# Patient Record
Sex: Female | Born: 1955 | Hispanic: Yes | Marital: Married | Smoking: Never smoker
Health system: Southern US, Community
[De-identification: ages and names within clinical notes are randomized; demographics above are authoritative.]

## PROBLEM LIST (undated history)

## (undated) DIAGNOSIS — K7581 Nonalcoholic steatohepatitis (NASH): Secondary | ICD-10-CM

## (undated) DIAGNOSIS — C801 Malignant (primary) neoplasm, unspecified: Secondary | ICD-10-CM

## (undated) DIAGNOSIS — C541 Malignant neoplasm of endometrium: Secondary | ICD-10-CM

## (undated) DIAGNOSIS — R102 Pelvic and perineal pain: Secondary | ICD-10-CM

## (undated) DIAGNOSIS — E119 Type 2 diabetes mellitus without complications: Secondary | ICD-10-CM

## (undated) DIAGNOSIS — I10 Essential (primary) hypertension: Secondary | ICD-10-CM

## (undated) DIAGNOSIS — E7800 Pure hypercholesterolemia, unspecified: Secondary | ICD-10-CM

## (undated) DIAGNOSIS — E669 Obesity, unspecified: Secondary | ICD-10-CM

## (undated) DIAGNOSIS — K219 Gastro-esophageal reflux disease without esophagitis: Secondary | ICD-10-CM

## (undated) DIAGNOSIS — M1990 Unspecified osteoarthritis, unspecified site: Secondary | ICD-10-CM

## (undated) HISTORY — PX: ABDOMINAL HYSTERECTOMY: SHX81

## (undated) HISTORY — PX: TUBAL LIGATION: SHX77

---

## 2006-09-20 ENCOUNTER — Ambulatory Visit: Payer: Self-pay | Admitting: Internal Medicine

## 2007-01-17 ENCOUNTER — Ambulatory Visit: Payer: Self-pay | Admitting: Internal Medicine

## 2007-01-25 ENCOUNTER — Ambulatory Visit: Payer: Self-pay | Admitting: Internal Medicine

## 2014-10-13 ENCOUNTER — Ambulatory Visit: Payer: Self-pay | Admitting: Gastroenterology

## 2016-04-12 ENCOUNTER — Other Ambulatory Visit: Payer: Self-pay | Admitting: Physical Medicine and Rehabilitation

## 2016-04-12 DIAGNOSIS — M5416 Radiculopathy, lumbar region: Secondary | ICD-10-CM

## 2016-05-05 ENCOUNTER — Encounter (HOSPITAL_COMMUNITY): Payer: Self-pay | Admitting: Radiology

## 2016-05-05 ENCOUNTER — Ambulatory Visit
Admission: RE | Admit: 2016-05-05 | Discharge: 2016-05-05 | Disposition: A | Discharge status: Home / Self Care | Payer: Self-pay | Source: Ambulatory Visit | Attending: Physical Medicine and Rehabilitation | Admitting: Physical Medicine and Rehabilitation

## 2016-05-05 DIAGNOSIS — M48061 Spinal stenosis, lumbar region without neurogenic claudication: Secondary | ICD-10-CM | POA: Insufficient documentation

## 2016-05-05 DIAGNOSIS — M5416 Radiculopathy, lumbar region: Secondary | ICD-10-CM | POA: Insufficient documentation

## 2016-11-24 ENCOUNTER — Ambulatory Visit
Admission: RE | Admit: 2016-11-24 | Discharge: 2016-11-24 | Disposition: A | Discharge status: Home / Self Care | Payer: Self-pay | Source: Ambulatory Visit | Attending: Internal Medicine | Admitting: Internal Medicine

## 2016-11-24 ENCOUNTER — Other Ambulatory Visit: Payer: Self-pay | Admitting: Internal Medicine

## 2016-11-24 DIAGNOSIS — R1012 Left upper quadrant pain: Secondary | ICD-10-CM

## 2016-11-24 DIAGNOSIS — K760 Fatty (change of) liver, not elsewhere classified: Secondary | ICD-10-CM | POA: Insufficient documentation

## 2016-11-24 DIAGNOSIS — R1032 Left lower quadrant pain: Secondary | ICD-10-CM | POA: Insufficient documentation

## 2016-11-24 HISTORY — DX: Malignant (primary) neoplasm, unspecified: C80.1

## 2016-11-24 HISTORY — DX: Essential (primary) hypertension: I10

## 2016-11-24 LAB — POCT I-STAT CREATININE: CREATININE: 0.6 mg/dL (ref 0.44–1.00)

## 2016-11-24 MED ORDER — IOPAMIDOL (ISOVUE-300) INJECTION 61%
100.0000 mL | Freq: Once | INTRAVENOUS | Status: AC | PRN
  Administered 2016-11-24: 100 mL via INTRAVENOUS

## 2017-05-26 ENCOUNTER — Other Ambulatory Visit: Payer: Self-pay | Admitting: Obstetrics and Gynecology

## 2017-05-26 DIAGNOSIS — R1032 Left lower quadrant pain: Secondary | ICD-10-CM

## 2017-10-30 ENCOUNTER — Encounter: Payer: Self-pay | Admitting: *Deleted

## 2017-10-31 ENCOUNTER — Encounter
Admission: RE | Disposition: A | Discharge status: Home / Self Care | Payer: Self-pay | Source: Ambulatory Visit | Attending: Internal Medicine

## 2017-10-31 ENCOUNTER — Ambulatory Visit: Payer: Self-pay | Admitting: Anesthesiology

## 2017-10-31 ENCOUNTER — Ambulatory Visit
Admission: RE | Admit: 2017-10-31 | Discharge: 2017-10-31 | Disposition: A | Discharge status: Home / Self Care | Payer: Self-pay | Source: Ambulatory Visit | Attending: Internal Medicine | Admitting: Internal Medicine

## 2017-10-31 ENCOUNTER — Encounter: Payer: Self-pay | Admitting: Anesthesiology

## 2017-10-31 DIAGNOSIS — K219 Gastro-esophageal reflux disease without esophagitis: Secondary | ICD-10-CM | POA: Insufficient documentation

## 2017-10-31 DIAGNOSIS — Z6841 Body Mass Index (BMI) 40.0 and over, adult: Secondary | ICD-10-CM | POA: Insufficient documentation

## 2017-10-31 DIAGNOSIS — E669 Obesity, unspecified: Secondary | ICD-10-CM | POA: Insufficient documentation

## 2017-10-31 DIAGNOSIS — I10 Essential (primary) hypertension: Secondary | ICD-10-CM | POA: Insufficient documentation

## 2017-10-31 DIAGNOSIS — M170 Bilateral primary osteoarthritis of knee: Secondary | ICD-10-CM | POA: Insufficient documentation

## 2017-10-31 DIAGNOSIS — E7800 Pure hypercholesterolemia, unspecified: Secondary | ICD-10-CM | POA: Insufficient documentation

## 2017-10-31 DIAGNOSIS — Z79899 Other long term (current) drug therapy: Secondary | ICD-10-CM | POA: Insufficient documentation

## 2017-10-31 DIAGNOSIS — K7581 Nonalcoholic steatohepatitis (NASH): Secondary | ICD-10-CM | POA: Insufficient documentation

## 2017-10-31 DIAGNOSIS — E119 Type 2 diabetes mellitus without complications: Secondary | ICD-10-CM | POA: Insufficient documentation

## 2017-10-31 DIAGNOSIS — Z8544 Personal history of malignant neoplasm of other female genital organs: Secondary | ICD-10-CM | POA: Insufficient documentation

## 2017-10-31 DIAGNOSIS — Z86010 Personal history of colonic polyps: Secondary | ICD-10-CM | POA: Insufficient documentation

## 2017-10-31 DIAGNOSIS — Z880 Allergy status to penicillin: Secondary | ICD-10-CM | POA: Insufficient documentation

## 2017-10-31 DIAGNOSIS — Z7984 Long term (current) use of oral hypoglycemic drugs: Secondary | ICD-10-CM | POA: Insufficient documentation

## 2017-10-31 DIAGNOSIS — K640 First degree hemorrhoids: Secondary | ICD-10-CM | POA: Insufficient documentation

## 2017-10-31 DIAGNOSIS — Z1211 Encounter for screening for malignant neoplasm of colon: Secondary | ICD-10-CM | POA: Insufficient documentation

## 2017-10-31 HISTORY — DX: Malignant neoplasm of endometrium: C54.1

## 2017-10-31 HISTORY — DX: Unspecified osteoarthritis, unspecified site: M19.90

## 2017-10-31 HISTORY — PX: COLONOSCOPY WITH PROPOFOL: SHX5780

## 2017-10-31 HISTORY — DX: Pelvic and perineal pain: R10.2

## 2017-10-31 HISTORY — DX: Obesity, unspecified: E66.9

## 2017-10-31 HISTORY — DX: Pure hypercholesterolemia, unspecified: E78.00

## 2017-10-31 HISTORY — DX: Nonalcoholic steatohepatitis (NASH): K75.81

## 2017-10-31 HISTORY — DX: Type 2 diabetes mellitus without complications: E11.9

## 2017-10-31 HISTORY — DX: Gastro-esophageal reflux disease without esophagitis: K21.9

## 2017-10-31 LAB — GLUCOSE, CAPILLARY: GLUCOSE-CAPILLARY: 164 mg/dL — AB (ref 65–99)

## 2017-10-31 SURGERY — COLONOSCOPY WITH PROPOFOL
Anesthesia: General

## 2017-10-31 MED ORDER — PROPOFOL 500 MG/50ML IV EMUL
INTRAVENOUS | Status: DC | PRN
  Administered 2017-10-31: 140 ug/kg/min via INTRAVENOUS

## 2017-10-31 MED ORDER — PROPOFOL 10 MG/ML IV BOLUS
INTRAVENOUS | Status: AC
  Filled 2017-10-31: qty 20

## 2017-10-31 MED ORDER — LIDOCAINE HCL (PF) 1 % IJ SOLN
2.0000 mL | Freq: Once | INTRAMUSCULAR | Status: AC
  Administered 2017-10-31: 0.3 mL via INTRADERMAL

## 2017-10-31 MED ORDER — LIDOCAINE 2% (20 MG/ML) 5 ML SYRINGE
INTRAMUSCULAR | Status: DC | PRN
  Administered 2017-10-31: 30 mg via INTRAVENOUS

## 2017-10-31 MED ORDER — LIDOCAINE HCL (PF) 1 % IJ SOLN
INTRAMUSCULAR | Status: AC
  Administered 2017-10-31: 0.3 mL via INTRADERMAL
  Filled 2017-10-31: qty 2

## 2017-10-31 MED ORDER — FENTANYL CITRATE (PF) 100 MCG/2ML IJ SOLN
INTRAMUSCULAR | Status: DC | PRN
  Administered 2017-10-31 (×2): 50 ug via INTRAVENOUS

## 2017-10-31 MED ORDER — PROPOFOL 500 MG/50ML IV EMUL
INTRAVENOUS | Status: AC
  Filled 2017-10-31: qty 50

## 2017-10-31 MED ORDER — LIDOCAINE HCL (PF) 2 % IJ SOLN
INTRAMUSCULAR | Status: AC
  Filled 2017-10-31: qty 10

## 2017-10-31 MED ORDER — FENTANYL CITRATE (PF) 100 MCG/2ML IJ SOLN
INTRAMUSCULAR | Status: AC
  Filled 2017-10-31: qty 2

## 2017-10-31 MED ORDER — SODIUM CHLORIDE 0.9 % IV SOLN
INTRAVENOUS | Status: DC
  Administered 2017-10-31: 09:00:00 via INTRAVENOUS

## 2017-10-31 MED ORDER — PROPOFOL 10 MG/ML IV BOLUS
INTRAVENOUS | Status: DC | PRN
  Administered 2017-10-31: 100 mg via INTRAVENOUS

## 2017-10-31 NOTE — Op Note (Signed)
[facility] Gastroenterology Patient Name: Diane Campos Procedure Date: 10/31/2017 9:02 AM MRN: [id number] Account #: [id number] Date of Birth: 08/05/1955 Admit Type: Outpatient Age: 62 Room: [facility] ENDO ROOM 3 Gender: Female Note Status: Finalized Procedure:            Colonoscopy Indications:          High risk colon cancer surveillance: Personal history                        of colonic polyps Providers:            Benay Pike. Sedale Jenifer MD, MD Medicines:            Propofol per Anesthesia Complications:        No immediate complications. Procedure:            Pre-Anesthesia Assessment:                       - The risks and benefits of the procedure and the                        sedation options and risks were discussed with the                        patient. All questions were answered and informed                        consent was obtained.                       - Patient identification and proposed procedure were                        verified prior to the procedure by the nurse. The                        procedure was verified in the procedure room.                       - ASA Grade Assessment: II - A patient with mild                        systemic disease.                       - After reviewing the risks and benefits, the patient                        was deemed in satisfactory condition to undergo the                        procedure.                       After obtaining informed consent, the colonoscope was                        passed under direct vision. Throughout the procedure,                        the patient's blood pressure, pulse, and oxygen  saturations were monitored continuously. The                        Colonoscope was introduced through the anus and                        advanced to the the cecum, identified by appendiceal                        orifice and ileocecal valve. The colonoscopy was                     performed without difficulty. The patient tolerated the                        procedure well. The quality of the bowel preparation                        was good. Findings:      The perianal and digital rectal examinations were normal. Pertinent       negatives include normal sphincter tone and no palpable rectal lesions.      The colon (entire examined portion) appeared normal.      Non-bleeding internal hemorrhoids were found during retroflexion. The       hemorrhoids were Grade I (internal hemorrhoids that do not prolapse). Impression:           - The entire examined colon is normal.                       - Non-bleeding internal hemorrhoids.                       - No specimens collected. Recommendation:       - Patient has a contact number available for                        emergencies. The signs and symptoms of potential                        delayed complications were discussed with the patient.                        Return to normal activities tomorrow. Written discharge                        instructions were provided to the patient.                       - Resume previous diet.                       - Continue present medications.                       - Repeat colonoscopy in 5 years for surveillance.                       - Return to GI office PRN. Procedure Code(s):    --- Professional ---                       Q2229, Colorectal cancer screening; colonoscopy  on                        individual at high risk Diagnosis Code(s):    --- Professional ---                       K64.0, First degree hemorrhoids                       Z86.010, Personal history of colonic polyps CPT copyright 2017 [employer]. All rights reserved. The codes documented in this report are preliminary and upon coder review may  be revised to meet current compliance requirements. Efrain Sella MD, MD 10/31/2017 9:23:10 AM This report has been signed  electronically. Number of Addenda: 0 Note Initiated On: 10/31/2017 9:02 AM Scope Withdrawal Time: 0 hours 7 minutes 25 seconds  Total Procedure Duration: 0 hours 9 minutes 56 seconds       [facility]

## 2017-10-31 NOTE — Interval H&P Note (Signed)
History and Physical Interval Note:  10/31/2017 8:46 AM  [place]  has presented today for surgery, with the diagnosis of HX POLYPS  The various methods of treatment have been discussed with the patient and family. After consideration of risks, benefits and other options for treatment, the patient has consented to  Procedure(s): COLONOSCOPY WITH PROPOFOL (N/A) as a surgical intervention .  The patient's history has been reviewed, patient examined, no change in status, stable for surgery.  I have reviewed the patient's chart and labs.  Questions were answered to the patient's satisfaction.     [place], [place]

## 2017-10-31 NOTE — Transfer of Care (Signed)
Immediate Anesthesia Transfer of Care Note  Patient: Diane Campos  Procedure(s) Performed: COLONOSCOPY WITH PROPOFOL (N/A )  Patient Location: PACU and Endoscopy Unit  Anesthesia Type:General  Level of Consciousness: sedated and drowsy  Airway & Oxygen Therapy: Patient Spontanous Breathing and Patient connected to nasal cannula oxygen  Post-op Assessment: Report given to RN and Post -op Vital signs reviewed and stable  Post vital signs: Reviewed and stable  Last Vitals:  Vitals Value Taken Time  BP    Temp    Pulse 79 10/31/2017  9:22 AM  Resp    SpO2 100 % 10/31/2017  9:22 AM  Vitals shown include unvalidated device data.  Last Pain:  Vitals:   10/31/17 0832  TempSrc: Tympanic  PainSc: 0-No pain         Complications: No apparent anesthesia complications

## 2017-10-31 NOTE — Anesthesia Post-op Follow-up Note (Signed)
Anesthesia QCDR form completed.        

## 2017-10-31 NOTE — H&P (Signed)
  Outpatient short stay form Pre-procedure 10/31/2017 8:44 AM Gerardo Territo K. Alice Reichert, M.D.  Primary Physician: Glendon Axe, M.D.  Reason for visit:  Personal hx of colon polyps.  History of present illness:  Patient presents for colon polyp surveillance. A Spanish interpreter was utilized.The patient denies abdominal pain, abnormal weight loss or rectal bleeding.     Current Facility-Administered Medications:  .  lidocaine (PF) (XYLOCAINE) 1 % injection, , , ,  .  0.9 %  sodium chloride infusion, , Intravenous, Continuous, Vincenza Dail K, MD .  lidocaine (PF) (XYLOCAINE) 1 % injection 2 mL, 2 mL, Intradermal, Once, Marvel, Benay Pike, MD  Medications Prior to Admission  Medication Sig Dispense Refill Last Dose  . atorvastatin (LIPITOR) 20 MG tablet Take 20 mg by mouth daily at 6 PM.     . diclofenac sodium (VOLTAREN) 1 % GEL Apply 2 g topically 2 (two) times daily at 10 AM and 5 PM.     . glimepiride (AMARYL) 4 MG tablet Take 4 mg by mouth 2 (two) times daily.     . metFORMIN (GLUCOPHAGE) 1000 MG tablet Take 1,000 mg by mouth 2 (two) times daily with a meal.     . pantoprazole (PROTONIX) 40 MG tablet Take 40 mg by mouth daily.     . pioglitazone (ACTOS) 15 MG tablet Take 15 mg by mouth daily.     . sitaGLIPtin (JANUVIA) 100 MG tablet Take 100 mg by mouth daily.     Marland Kitchen triamcinolone cream (KENALOG) 0.1 % Apply 1 application topically 2 (two) times daily.        Allergies  Allergen Reactions  . Penicillins Itching and Rash     Past Medical History:  Diagnosis Date  . Arthritis    Osteoarthritis of both knees  . Cancer ([place])   . Diabetes mellitus without complication ([place])   . Endometrial cancer ([place])   . GERD (gastroesophageal reflux disease)   . Hypercholesterolemia   . Hypertension   . Nonalcoholic steatohepatitis ([place])   . Obesity   . Pelvic pain in female     Review of systems:   Negative.  Physical Exam  Gen: Alert, oriented. Appears stated age.  HEENT: [place]/AT.  PERRLA. Lungs: CTA, no wheezes. CV: RR nl S1, S2. Abd: soft, benign, no masses. BS+ Ext: No edema. Pulses 2+    Planned procedures:  1. Colonoscopy. The patient understands the nature of the planned procedure, indications, risks, alternatives and potential complications including but not limited to bleeding, infection, perforation, damage to internal organs and possible oversedation/side effects from anesthesia. The patient agrees and gives consent to proceed.  Please refer to procedure notes for findings, recommendations and patient disposition/instructions.    Ladona Rosten K. Alice Reichert, M.D. Gastroenterology 10/31/2017  8:44 AM

## 2017-10-31 NOTE — Anesthesia Postprocedure Evaluation (Signed)
Anesthesia Post Note  Patient: Diane Campos  Procedure(s) Performed: COLONOSCOPY WITH PROPOFOL (N/A )  Patient location during evaluation: Endoscopy Anesthesia Type: General Level of consciousness: awake and alert Pain management: pain level controlled Vital Signs Assessment: post-procedure vital signs reviewed and stable Respiratory status: spontaneous breathing, nonlabored ventilation, respiratory function stable and patient connected to nasal cannula oxygen Cardiovascular status: blood pressure returned to baseline and stable Postop Assessment: no apparent nausea or vomiting Anesthetic complications: no     Last Vitals:  Vitals:   10/31/17 0923 10/31/17 0924  BP:  (!) 97/56  Pulse:  76  Resp: (!) 9 10  Temp: (!) 36.1 C (!) 36.1 C  SpO2:  100%    Last Pain:  Vitals:   10/31/17 0953  TempSrc:   PainSc: 0-No pain                 Halley Shepheard S

## 2017-10-31 NOTE — Anesthesia Preprocedure Evaluation (Signed)
Anesthesia Evaluation  Patient identified by MRN, date of birth, ID band Patient awake    Reviewed: Allergy & Precautions, NPO status , Patient's Chart, lab work & pertinent test results, reviewed documented beta blocker date and time   Airway Mallampati: II  TM Distance: >3 FB     Dental  (+) Chipped   Pulmonary           Cardiovascular hypertension, Pt. on medications      Neuro/Psych    GI/Hepatic GERD  ,(+) Hepatitis -  Endo/Other  diabetes, Type 2  Renal/GU      Musculoskeletal  (+) Arthritis ,   Abdominal   Peds  Hematology   Anesthesia Other Findings   Reproductive/Obstetrics                             Anesthesia Physical Anesthesia Plan  ASA: II  Anesthesia Plan: General   Post-op Pain Management:    Induction: Intravenous  PONV Risk Score and Plan:   Airway Management Planned:   Additional Equipment:   Intra-op Plan:   Post-operative Plan:   Informed Consent: I have reviewed the patients History and Physical, chart, labs and discussed the procedure including the risks, benefits and alternatives for the proposed anesthesia with the patient or authorized representative who has indicated his/her understanding and acceptance.     Plan Discussed with: CRNA  Anesthesia Plan Comments:         Anesthesia Quick Evaluation

## 2017-11-02 ENCOUNTER — Encounter: Payer: Self-pay | Admitting: Internal Medicine

## 2018-02-17 IMAGING — CT CT ABD-PELV W/ CM
2 of 5 series · 16 of 46 positions shown, 18 images · IV contrast (APPLIED)
Comparison: Abdominal ultrasound January 17, 2007

CLINICAL DATA: LEFT abdominal pain for 3 months, worsening for few
weeks. History of uterine cancer and hysterectomy.

EXAM:
CT ABDOMEN AND PELVIS WITH CONTRAST
TECHNIQUE: Multidetector CT imaging of the abdomen and pelvis was performed
using the standard protocol following bolus administration of
intravenous contrast.
CONTRAST:  100mL [id number] IOPAMIDOL ([id number]) INJECTION 61%

[Series 2: axial st · axial · 0.84mm/px · z∈[-202,+188]mm · 13 of 88 slices shown, 15 images]
[im 5/88  soft-tissue]
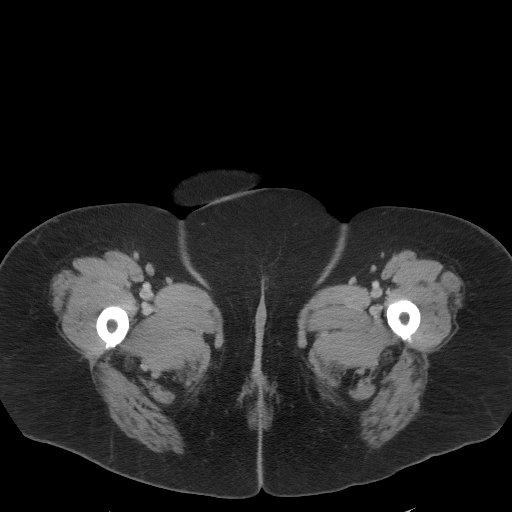
[im 5/88  bone]
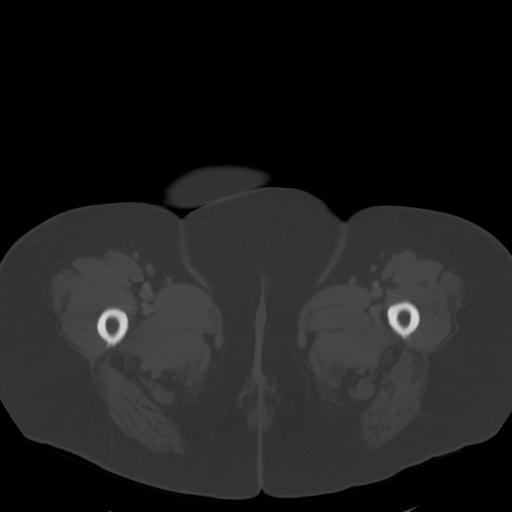
[im 10/88  soft-tissue]
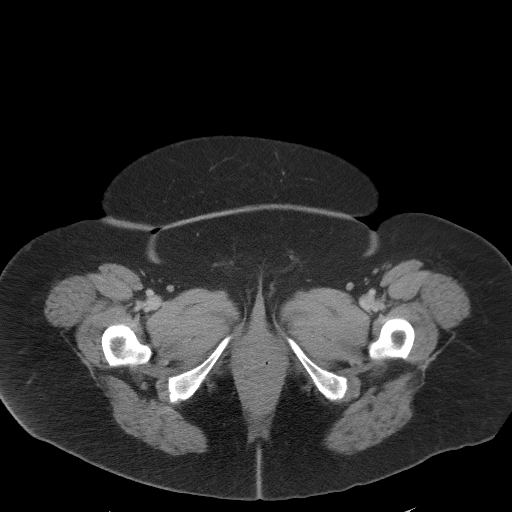
[im 20/88  soft-tissue]
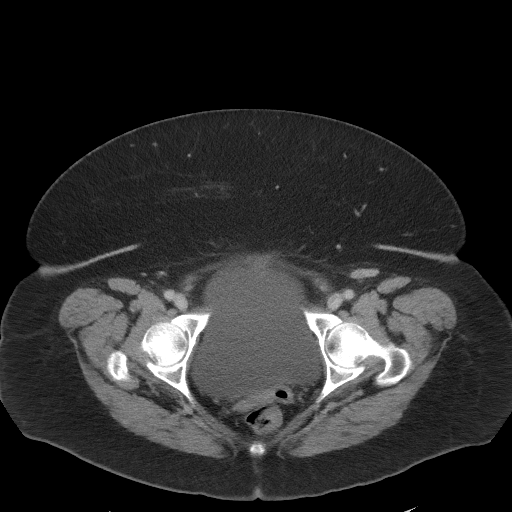
[im 25/88  soft-tissue]
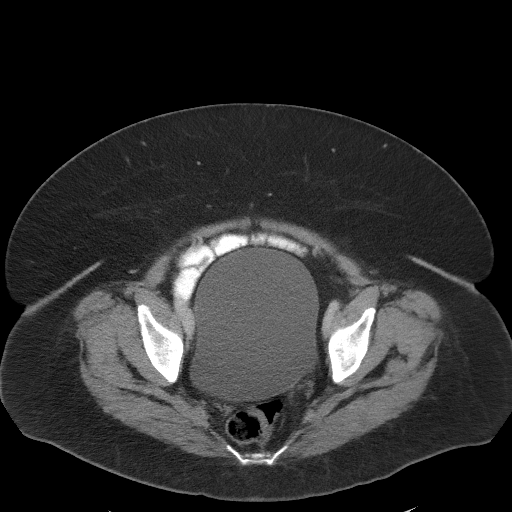
[im 30/88  soft-tissue]
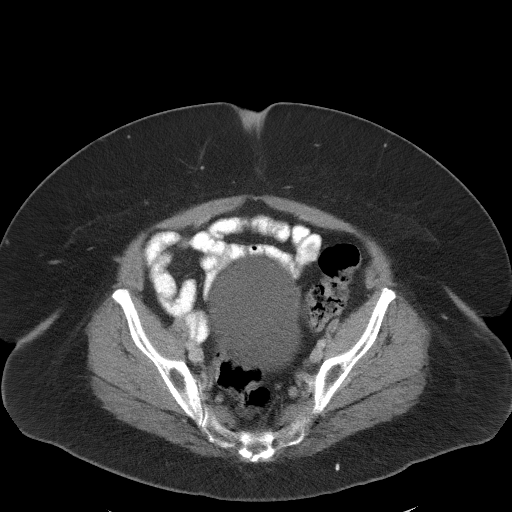
[im 39/88  soft-tissue]
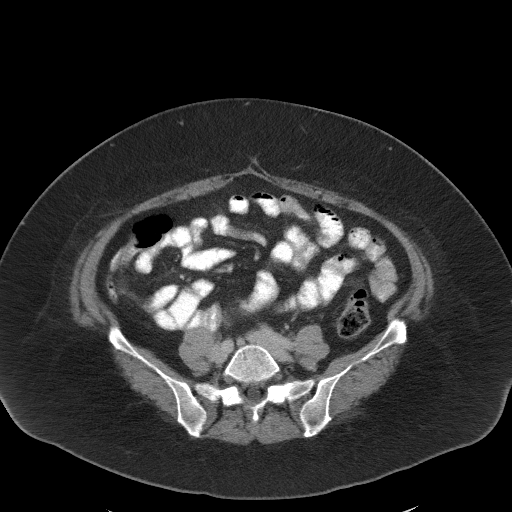
[im 44/88  soft-tissue]
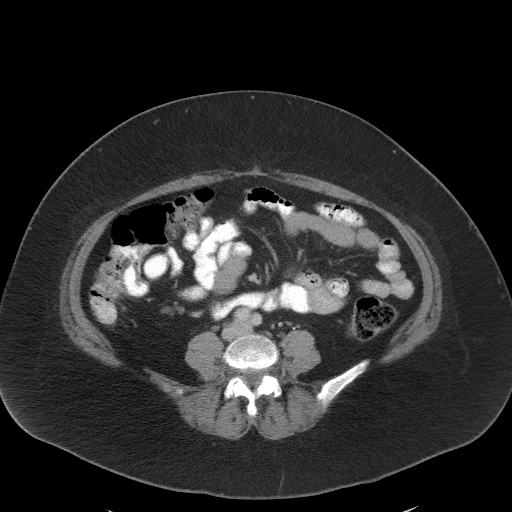
[im 49/88  soft-tissue]
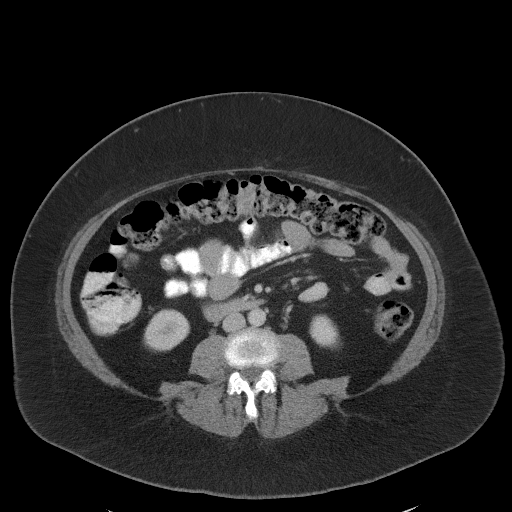
[im 59/88  soft-tissue]
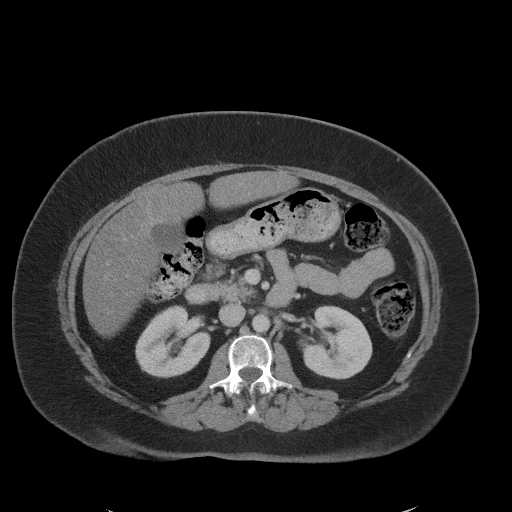
[im 59/88  bone]
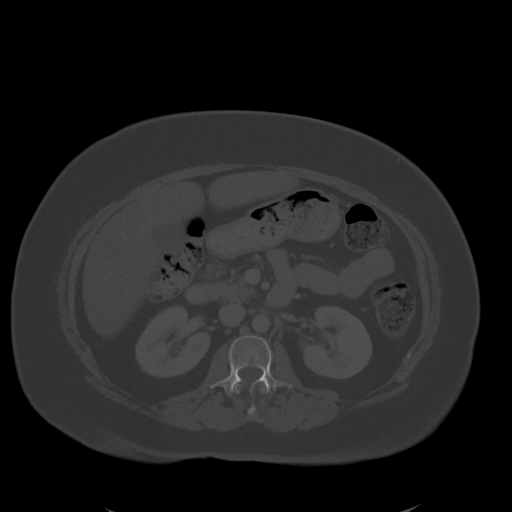
[im 63/88  soft-tissue]
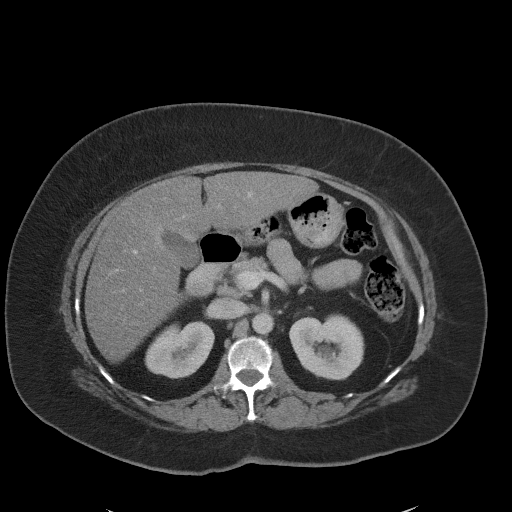
[im 68/88  soft-tissue]
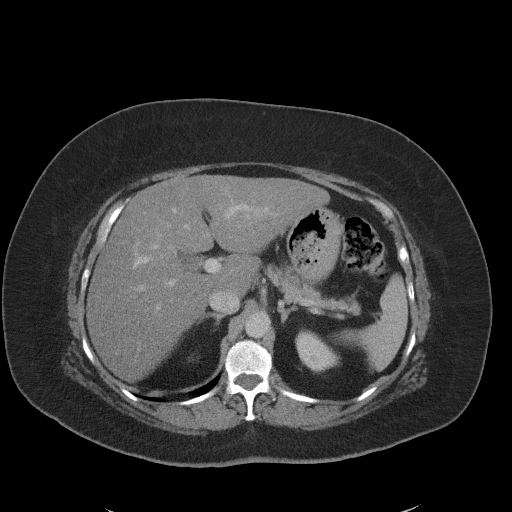
[im 78/88  soft-tissue]
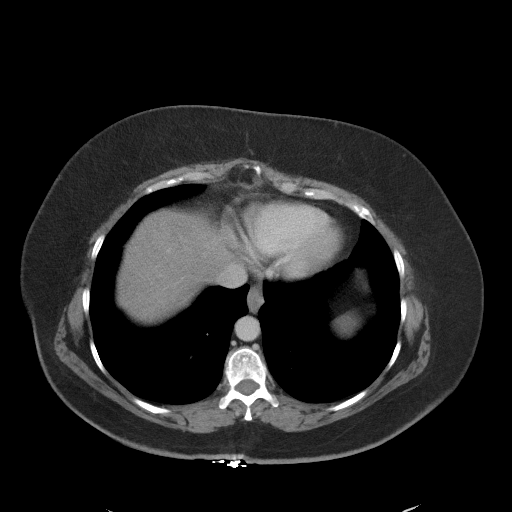
[im 83/88  soft-tissue]
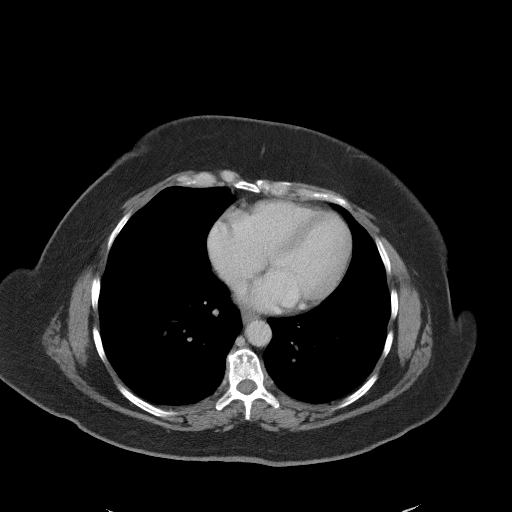

[Series 5: coronal st · coronal · 0.83mm/px · 3 of 97 slices shown]
[im 33/97  soft-tissue]
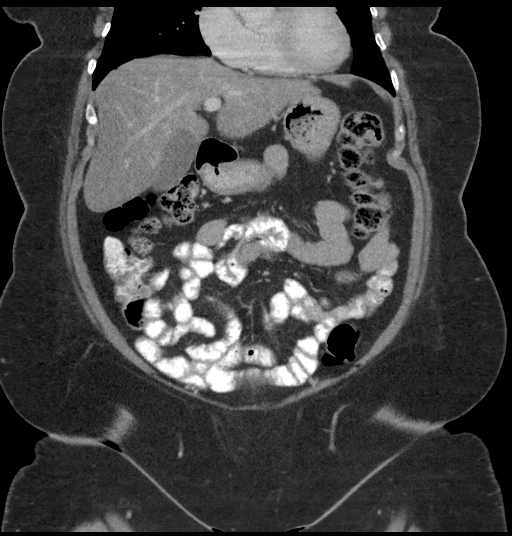
[im 43/97  soft-tissue]
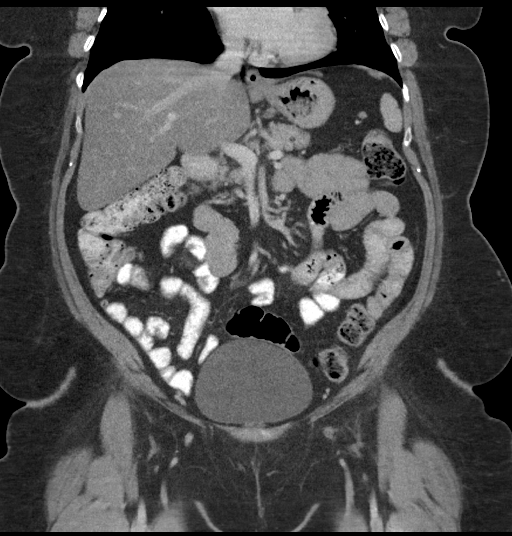
[im 54/97  soft-tissue]
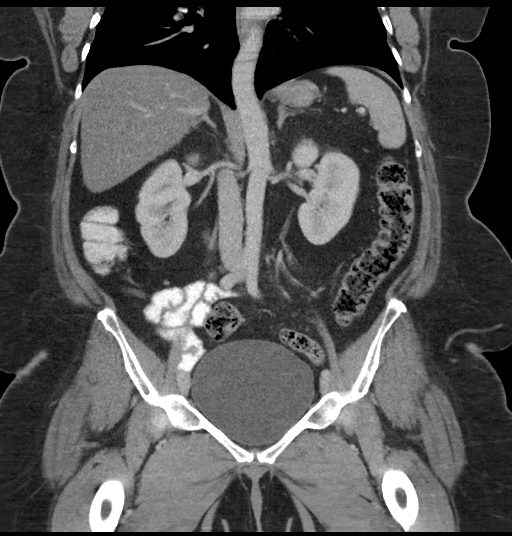

[16 of 46 positions shown; findings below may reference images not displayed]

FINDINGS: LOWER CHEST: Lung bases are clear. Included heart size is normal. No
pericardial effusion.

HEPATOBILIARY: Hypodense liver compatible with steatosis with mild
focal fatty sparing about the falciform ligament.

PANCREAS: Normal.

SPLEEN: Normal.

ADRENALS/URINARY TRACT: Kidneys are orthotopic, demonstrating
symmetric enhancement. No nephrolithiasis, hydronephrosis or solid
renal masses. The unopacified ureters are normal in course and
caliber. Delayed imaging through the kidneys demonstrates symmetric
prompt contrast excretion within the proximal urinary collecting
system. Urinary bladder is well distended and unremarkable. Normal
adrenal glands.

STOMACH/BOWEL: The stomach, small and large bowel are normal in
course and caliber without inflammatory changes. Mild retained large
bowel stool. Normal appendix.

VASCULAR/LYMPHATIC: Aortoiliac vessels are normal in course and
caliber. No lymphadenopathy by CT size criteria.

REPRODUCTIVE: Status post hysterectomy.

OTHER: No intraperitoneal free fluid or free air.

MUSCULOSKELETAL: Nonacute. Mild sacroiliac osteoarthrosis. Severe
T11-12 degenerative disc.
IMPRESSION: No acute intra-abdominal or pelvic process.

Hepatic steatosis.

These results will be called to the ordering clinician or
representative by the Radiologist Assistant, and communication
documented in the [employer] Dashboard.
